# Patient Record
Sex: Male | Born: 1960 | Race: White | Hispanic: No | Marital: Married
Health system: Midwestern US, Academic
[De-identification: ages and names within clinical notes are randomized; demographics above are authoritative.]

---

## 2016-11-06 ENCOUNTER — Encounter: Admit: 2016-11-06 | Discharge: 2016-11-07 | Payer: MEDICAID

## 2016-11-06 ENCOUNTER — Encounter: Admit: 2016-11-06 | Discharge: 2016-11-06 | Payer: MEDICAID

## 2017-08-30 ENCOUNTER — Encounter: Admit: 2017-08-30 | Discharge: 2017-08-30 | Payer: MEDICAID

## 2017-09-04 ENCOUNTER — Encounter: Admit: 2017-09-04 | Discharge: 2017-09-04 | Payer: MEDICAID

## 2017-09-13 ENCOUNTER — Ambulatory Visit: Admit: 2017-09-13 | Discharge: 2017-09-14 | Payer: MEDICAID

## 2017-09-13 ENCOUNTER — Encounter: Admit: 2017-09-13 | Discharge: 2017-09-13 | Payer: MEDICAID

## 2017-09-13 DIAGNOSIS — M545 Low back pain: ICD-10-CM

## 2017-09-13 DIAGNOSIS — M1990 Unspecified osteoarthritis, unspecified site: Principal | ICD-10-CM

## 2017-09-13 DIAGNOSIS — F419 Anxiety disorder, unspecified: ICD-10-CM

## 2017-09-13 DIAGNOSIS — M5116 Intervertebral disc disorders with radiculopathy, lumbar region: Principal | ICD-10-CM

## 2017-09-13 DIAGNOSIS — E785 Hyperlipidemia, unspecified: ICD-10-CM

## 2017-09-13 DIAGNOSIS — E119 Type 2 diabetes mellitus without complications: ICD-10-CM

## 2017-09-13 DIAGNOSIS — Z720 Tobacco use: ICD-10-CM

## 2017-09-14 DIAGNOSIS — M5416 Radiculopathy, lumbar region: ICD-10-CM

## 2017-09-14 DIAGNOSIS — M5412 Radiculopathy, cervical region: ICD-10-CM

## 2019-07-24 ENCOUNTER — Encounter: Admit: 2019-07-24 | Discharge: 2019-07-24 | Payer: MEDICAID

## 2019-07-24 ENCOUNTER — Ambulatory Visit: Admit: 2019-07-24 | Discharge: 2019-07-24 | Payer: MEDICAID

## 2019-07-24 DIAGNOSIS — R1010 Upper abdominal pain, unspecified: Secondary | ICD-10-CM

## 2019-07-24 DIAGNOSIS — M1990 Unspecified osteoarthritis, unspecified site: Secondary | ICD-10-CM

## 2019-07-24 DIAGNOSIS — F419 Anxiety disorder, unspecified: Secondary | ICD-10-CM

## 2019-07-24 DIAGNOSIS — E119 Type 2 diabetes mellitus without complications: Secondary | ICD-10-CM

## 2019-07-24 DIAGNOSIS — M545 Low back pain: Secondary | ICD-10-CM

## 2019-07-24 DIAGNOSIS — Z20822 Encounter for screening laboratory testing for COVID-19 virus in asymptomatic patient: Secondary | ICD-10-CM

## 2019-07-24 DIAGNOSIS — Z720 Tobacco use: Secondary | ICD-10-CM

## 2019-07-24 DIAGNOSIS — E785 Hyperlipidemia, unspecified: Secondary | ICD-10-CM

## 2019-07-24 MED ORDER — SODIUM,POTASSIUM,MAG SULFATES 17.5-3.13-1.6 GRAM PO SOLR
354 mL | ORAL | 0 refills | 30.00000 days | Status: AC
Start: 2019-07-24 — End: ?

## 2019-07-24 MED ORDER — PEG-ELECTROLYTE SOLN 420 GRAM PO SOLR
Freq: Once | 0 refills | Status: AC
Start: 2019-07-24 — End: ?

## 2019-08-01 ENCOUNTER — Encounter: Admit: 2019-08-01 | Discharge: 2019-08-02 | Payer: MEDICAID

## 2019-08-02 DIAGNOSIS — Z20822 Contact with and (suspected) exposure to covid-19: Secondary | ICD-10-CM

## 2019-08-04 ENCOUNTER — Encounter: Admit: 2019-08-04 | Discharge: 2019-08-04 | Payer: MEDICAID

## 2019-08-04 DIAGNOSIS — F419 Anxiety disorder, unspecified: Secondary | ICD-10-CM

## 2019-08-04 DIAGNOSIS — M545 Low back pain: Secondary | ICD-10-CM

## 2019-08-04 DIAGNOSIS — E785 Hyperlipidemia, unspecified: Secondary | ICD-10-CM

## 2019-08-04 DIAGNOSIS — E119 Type 2 diabetes mellitus without complications: Secondary | ICD-10-CM

## 2019-08-04 DIAGNOSIS — Z720 Tobacco use: Secondary | ICD-10-CM

## 2019-08-04 DIAGNOSIS — M1990 Unspecified osteoarthritis, unspecified site: Secondary | ICD-10-CM

## 2019-08-04 MED ORDER — PROPOFOL 10 MG/ML IV EMUL 50 ML (INFUSION)(AM)(OR)
INTRAVENOUS | 0 refills | Status: DC
Start: 2019-08-04 — End: 2019-08-04

## 2019-08-04 MED ORDER — DIPHENHYDRAMINE HCL 50 MG/ML IJ SOLN
12.5 mg | Freq: Once | INTRAVENOUS | 0 refills | PRN
Start: 2019-08-04 — End: ?

## 2019-08-04 MED ORDER — PROMETHAZINE 25 MG/ML IJ SOLN
6.25 mg | INTRAVENOUS | 0 refills | PRN
Start: 2019-08-04 — End: ?

## 2019-08-04 MED ORDER — ONDANSETRON HCL (PF) 4 MG/2 ML IJ SOLN
4 mg | Freq: Once | INTRAVENOUS | 0 refills | PRN
Start: 2019-08-04 — End: ?

## 2019-08-04 MED ORDER — LIDOCAINE (PF) 100 MG/5 ML (2 %) IV SYRG
INTRAVENOUS | 0 refills | Status: DC
Start: 2019-08-04 — End: 2019-08-04

## 2019-08-04 MED ORDER — LACTATED RINGERS IV SOLP
INTRAVENOUS | 0 refills | Status: DC
Start: 2019-08-04 — End: 2019-08-09

## 2019-08-04 MED ORDER — MEPERIDINE (PF) 25 MG/ML IJ SYRG
12.5 mg | INTRAVENOUS | 0 refills | PRN
Start: 2019-08-04 — End: ?

## 2019-08-04 MED ORDER — LIDOCAINE (PF) 10 MG/ML (1 %) IJ SOLN
.2 mL | INTRAMUSCULAR | 0 refills | Status: DC | PRN
Start: 2019-08-04 — End: 2019-08-09

## 2019-08-05 ENCOUNTER — Encounter: Admit: 2019-08-05 | Discharge: 2019-08-05 | Payer: MEDICAID

## 2019-08-05 DIAGNOSIS — E119 Type 2 diabetes mellitus without complications: Secondary | ICD-10-CM

## 2019-08-05 DIAGNOSIS — Z720 Tobacco use: Secondary | ICD-10-CM

## 2019-08-05 DIAGNOSIS — E785 Hyperlipidemia, unspecified: Secondary | ICD-10-CM

## 2019-08-05 DIAGNOSIS — M1990 Unspecified osteoarthritis, unspecified site: Secondary | ICD-10-CM

## 2019-08-05 DIAGNOSIS — F419 Anxiety disorder, unspecified: Secondary | ICD-10-CM

## 2019-08-05 DIAGNOSIS — M545 Low back pain: Secondary | ICD-10-CM

## 2019-08-08 ENCOUNTER — Encounter: Admit: 2019-08-08 | Discharge: 2019-08-08 | Payer: MEDICAID

## 2019-08-17 ENCOUNTER — Encounter: Admit: 2019-08-17 | Discharge: 2019-08-17 | Payer: MEDICAID

## 2019-08-17 NOTE — Telephone Encounter
Crystal with Dr. Isaac Bliss office in [place] called and LVM stating Quadry had called her stating he was to follow-up with their office. She said he was to transfer care to our office.     Called and LVM for Crystal letting her know that what I see is a referral to the GI lab and he has had a colonoscopy done here at [facility].     Crystal called back returning my call.     LVM for Crystal letting her know I have reached out to one of our Administrative assistants for further assistance and will let her know once I hear back .

## 2019-10-01 ENCOUNTER — Encounter: Admit: 2019-10-01 | Discharge: 2019-10-01 | Payer: MEDICAID

## 2021-08-17 IMAGING — CT LOW_DOSE_LUNG(Adult)
2 of 6 series · 10 of 36 positions shown, 13 images · non-contrast
Comparison: none

[Series 2: lung ax 1.00 br44 s3 · axial · 0.55mm/px · z∈[+1601,+1690]mm · 9 of 103 slices shown, 12 images]
[im 7/103  mediastinal]
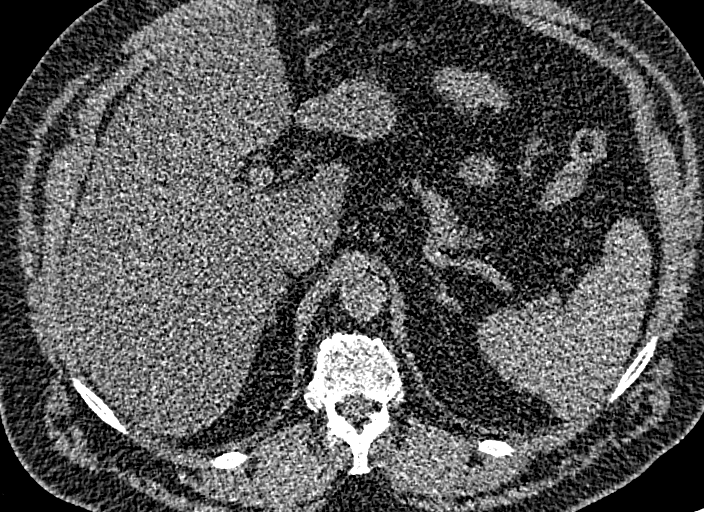
[im 7/103  lung]
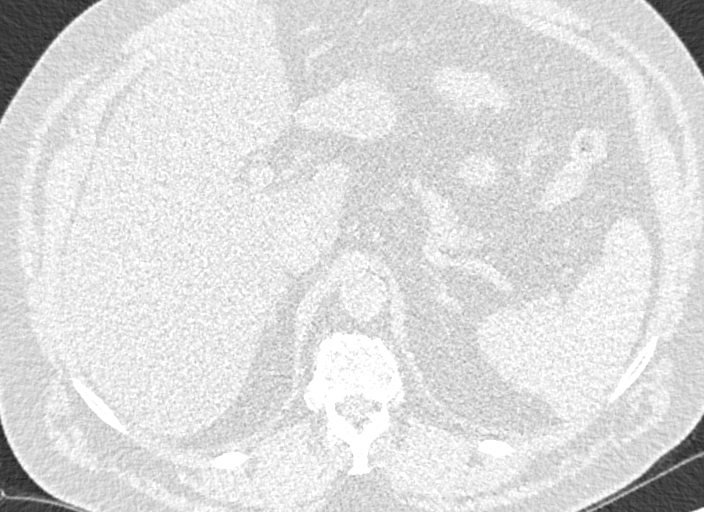
[im 21/103  lung]
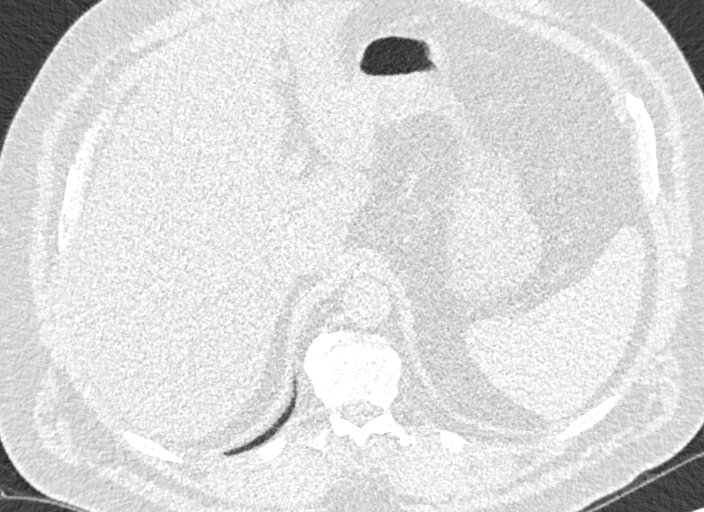
[im 28/103  lung]
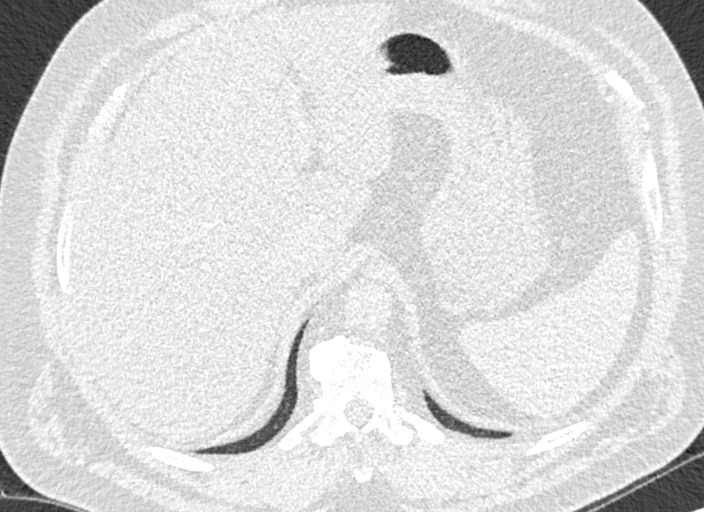
[im 41/103  lung]
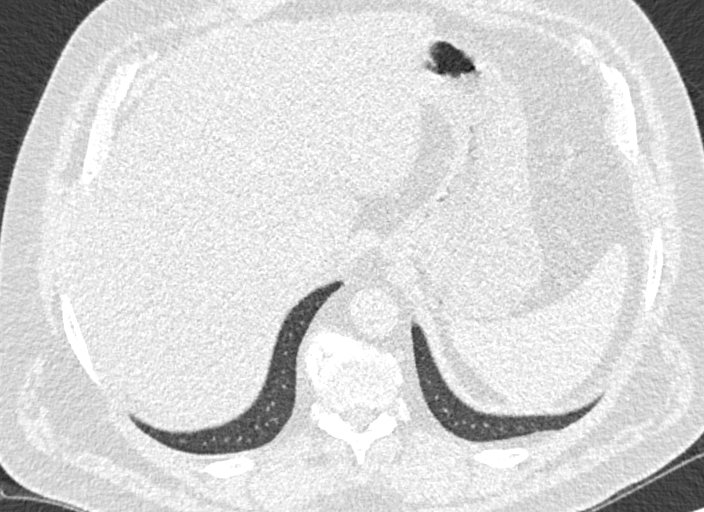
[im 55/103  mediastinal]
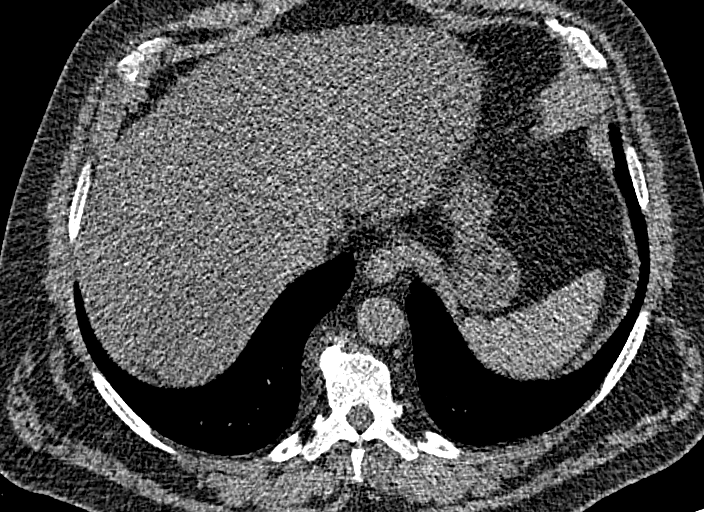
[im 55/103  lung]
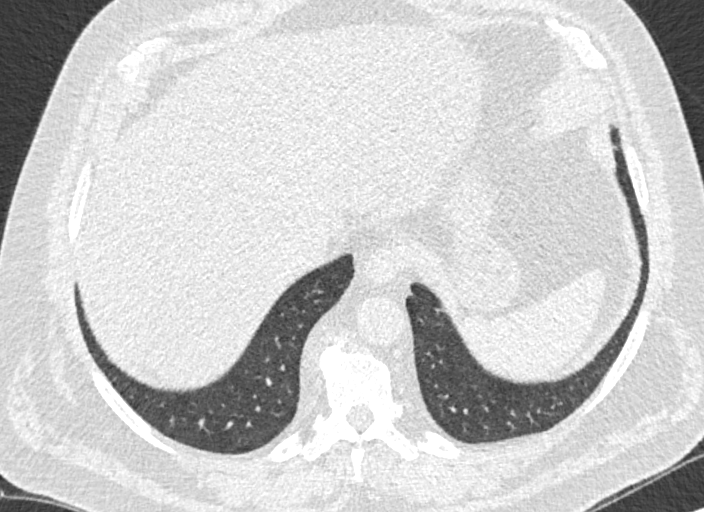
[im 62/103  lung]
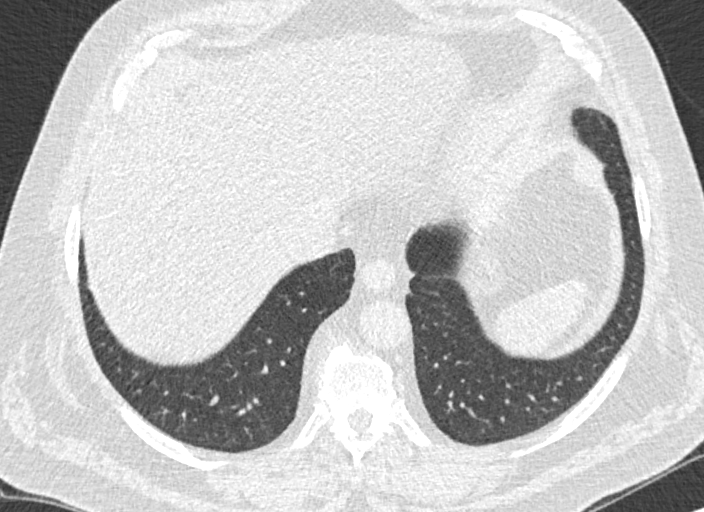
[im 75/103  lung]
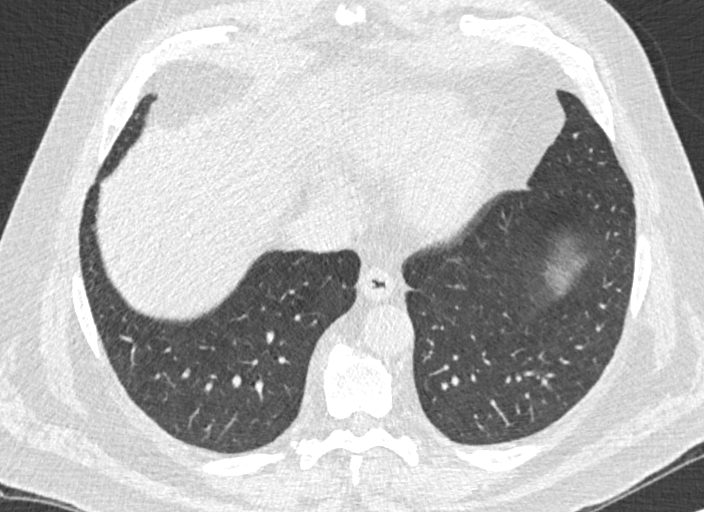
[im 82/103  lung]
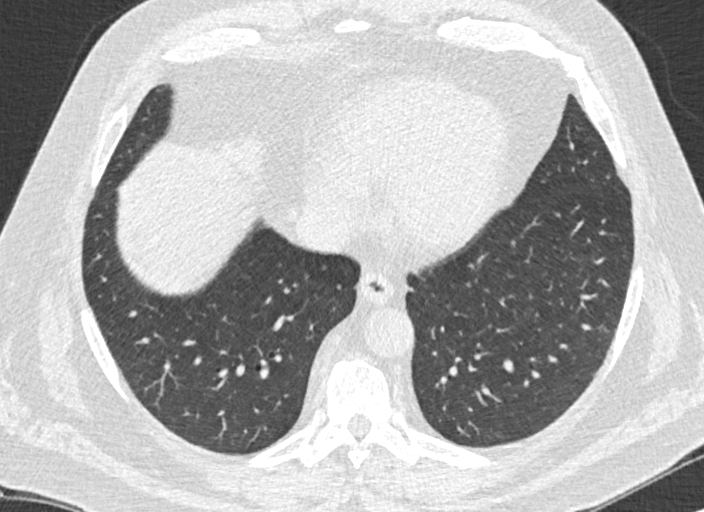
[im 96/103  mediastinal]
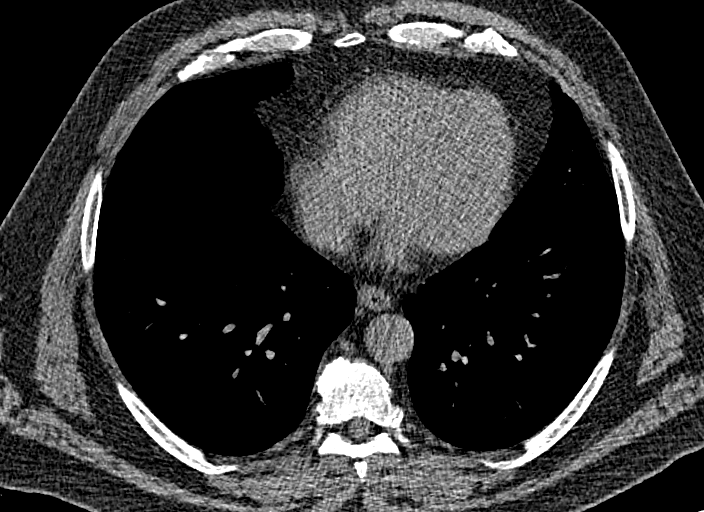
[im 96/103  lung]
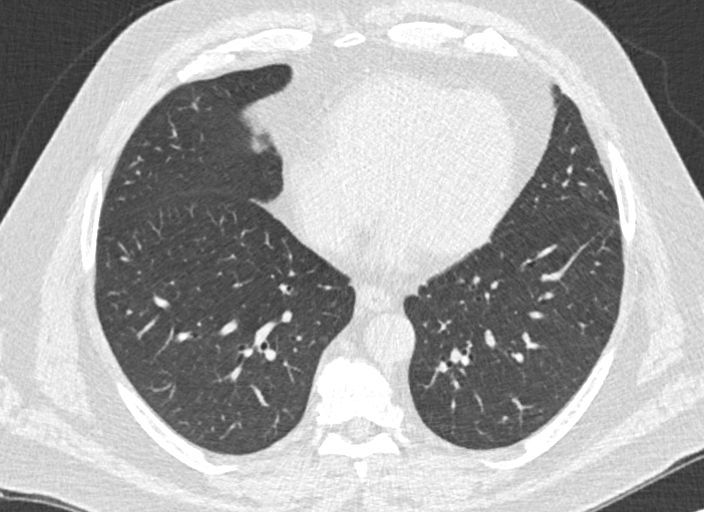

[Series 4: lung cor 1.00 br44 s3 · coronal · 0.54mm/px · 1 of 253 slices shown]
[im 127/253  lung]
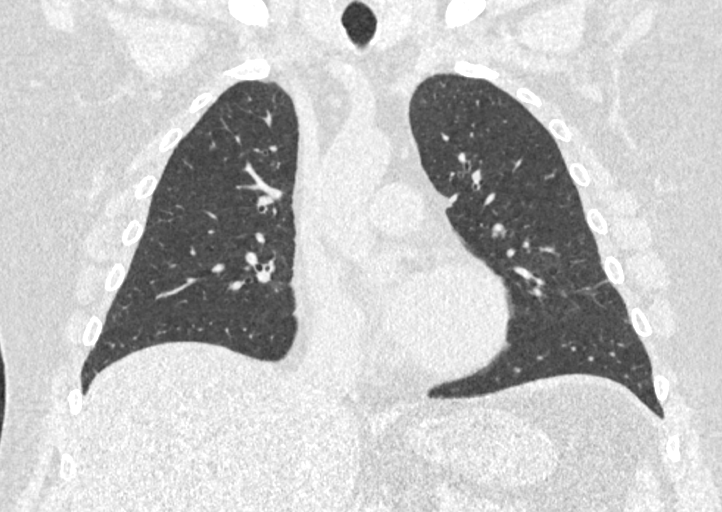

[10 of 36 positions shown; findings below may reference images not displayed]

DIAGNOSTIC STUDIES

EXAM

CT Lung-Screening

INDICATION

CURRENT EVERYDAY SMOKER, 2-3 PPD X 43 YRS. LIVES W/CURRENT EVERYDAY SMOKER. NO CURRENT CHEST
COMPLAINTS. MOTHER HAD LUNG CA. CT/NM 2/0.  HB

TECHNIQUE

Low-dose noncontrast CT of the lungs has been obtained.

All CT scans at this facility use dose modulation, iterative reconstruction, and/or weight based
dosing when appropriate to reduce radiation dose to as low as reasonably achievable.

COMPARISONS

There are no previous CT scans of the chest available for comparison at the time of dictation.

Number of previous computed tomography exams in the last 12 months is 2 .

Number of previous nuclear medicine myocardial perfusion studies in the last 12 months is 0 .

FINDINGS

The trachea lies in the midline. There are no focal thyroid masses. There is calcified plaque in
the aortic arch and along the origin of the great vessels.

The main pulmonary artery and the right and left pulmonary arteries are normal in caliber and
contour. Evaluation is limited without the benefit of intravenous contrast material.

There is no evidence of mediastinal or hilar adenopathy. Calcified left hilar lymph nodes are
consistent with previous granulomatous disease. There is a calcified granuloma in the left upper
lobe laterally best seen on series 2, image 123.

There are no suspicious spiculated pulmonary nodules. Central peribronchial thickening is likely
reflective of chronic bronchitis.

The heart is normal in size. There is atherosclerotic calcification of the native coronary
arteries. There are no pleural or pericardial effusions.

Thoracic spondylosis is noted. There are no blastic or lytic lesions. The esophagus appears normal
in size and shape. Axial images through the upper abdomen demonstrate prior cholecystectomy. There
are no adrenal masses.

IMPRESSION

There are no suspicious pulmonary nodules. Calcified hilar lymph nodes and calcified left upper
lobe granuloma are consistent with previous granulomatous disease.

LUNG-RADS: 2- Benign appearance or behavior.

RECOMMENDATION: Annual low dose lung screening CT is recommended for 15 years after smoking
cessation or until age 77.

Tech Notes:

CURRENT EVERYDAY SMOKER, 2-3 PPD X 43 YRS. LIVES W/CURRENT EVERYDAY SMOKER. NO CURRENT CHEST
COMPLAINTS. MOTHER HAD LUNG CA. CT/NM 2/0.
HB

## 2021-09-07 ENCOUNTER — Encounter: Admit: 2021-09-07 | Discharge: 2021-09-07 | Payer: MEDICAID

## 2022-05-24 IMAGING — CR [ID]
2 series · 2 of 2 positions shown · non-contrast
Comparison: none

[chest pa]
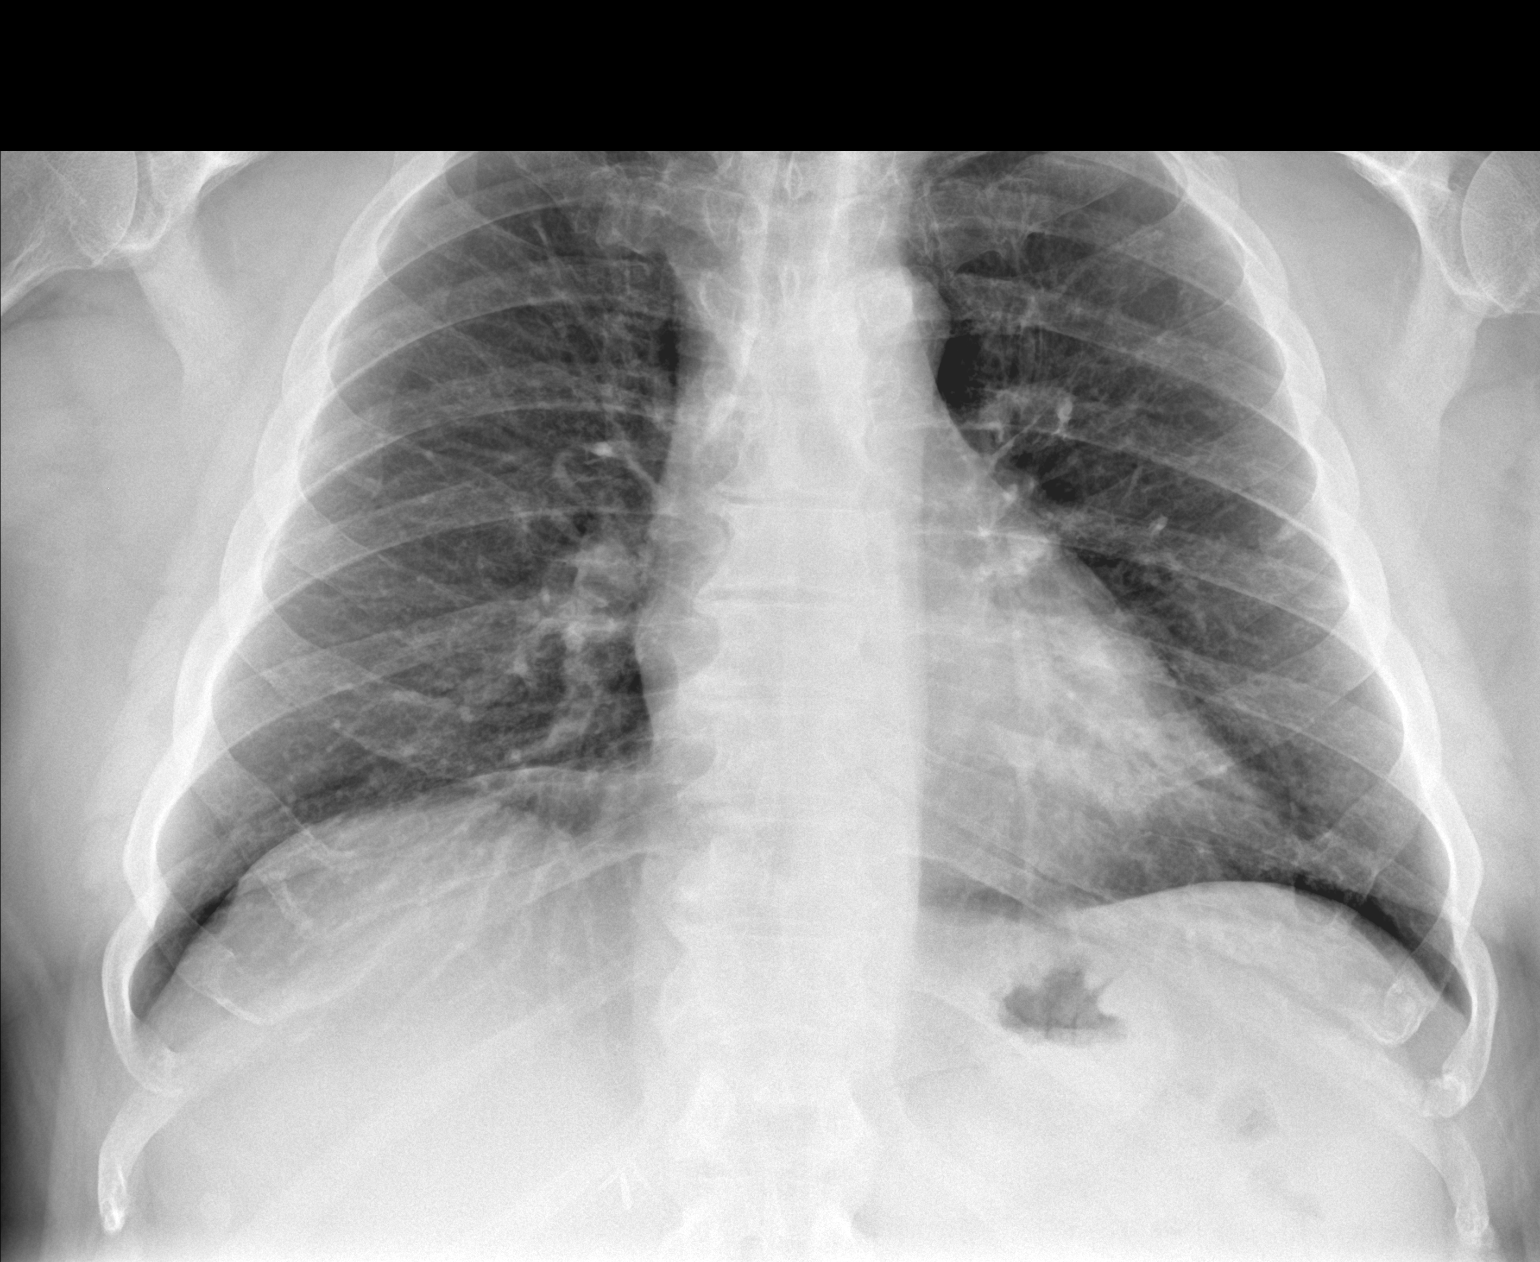

[chest lat]
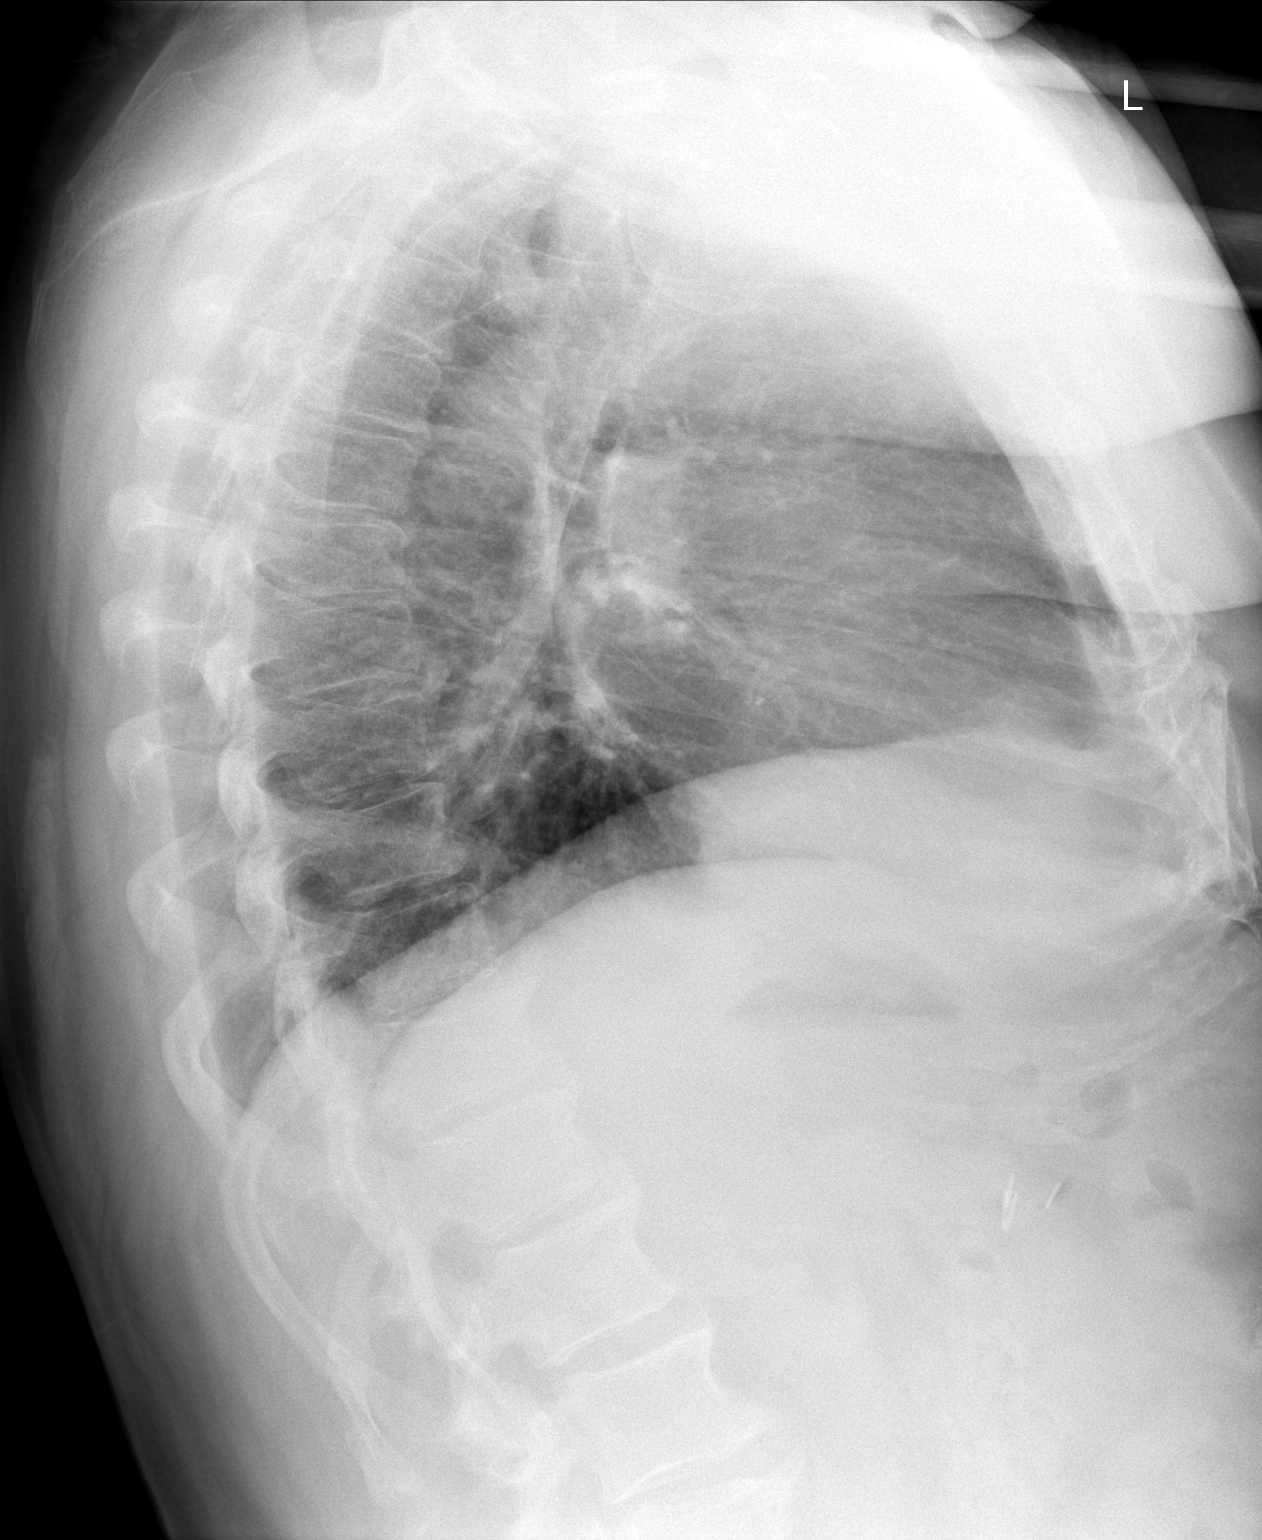

[2 of 2 positions shown; findings below may reference images not displayed]

EXAM

XR chest 2V

INDICATION

Shortness of breath

TECHNIQUE

PA and Lateral views of the chest

COMPARISONS

10/20/19

FINDINGS

No radiographically apparent pleural effusion, consolidation, or pneumothorax.

The cardiomediastinal silhouette is normal in size.

The osseous structures are without an acute osseous abnormality. Cholecystectomy clips in the right
upper quadrant.

IMPRESSION
1. No radiographic evidence of an acute cardiopulmonary process.

Tech Notes:

SOB, SMOKER- 46 YRS, COUGH X 2 WEEKS, PAIN IN CHEST AND BACK WHEN COUGHING

## 2022-08-08 ENCOUNTER — Encounter: Admit: 2022-08-08 | Discharge: 2022-08-08 | Payer: MEDICAID

## 2022-08-08 NOTE — Telephone Encounter
Referral

## 2022-08-14 ENCOUNTER — Encounter: Admit: 2022-08-14 | Discharge: 2022-08-14 | Payer: MEDICAID

## 2022-09-07 ENCOUNTER — Encounter: Admit: 2022-09-07 | Discharge: 2022-09-07 | Payer: MEDICAID

## 2022-09-07 ENCOUNTER — Ambulatory Visit: Admit: 2022-09-07 | Discharge: 2022-09-07 | Payer: MEDICAID

## 2022-09-07 ENCOUNTER — Ambulatory Visit: Admit: 2022-09-07 | Discharge: 2022-09-08 | Payer: MEDICAID

## 2022-09-07 DIAGNOSIS — H2513 Age-related nuclear cataract, bilateral: Secondary | ICD-10-CM

## 2022-09-07 DIAGNOSIS — M545 Low back pain: Secondary | ICD-10-CM

## 2022-09-07 DIAGNOSIS — H5461 Unqualified visual loss, right eye, normal vision left eye: Secondary | ICD-10-CM

## 2022-09-07 DIAGNOSIS — F419 Anxiety disorder, unspecified: Secondary | ICD-10-CM

## 2022-09-07 DIAGNOSIS — E785 Hyperlipidemia, unspecified: Secondary | ICD-10-CM

## 2022-09-07 DIAGNOSIS — Z720 Tobacco use: Secondary | ICD-10-CM

## 2022-09-07 DIAGNOSIS — M1990 Unspecified osteoarthritis, unspecified site: Secondary | ICD-10-CM

## 2022-09-07 DIAGNOSIS — E119 Type 2 diabetes mellitus without complications: Secondary | ICD-10-CM

## 2022-09-07 LAB — SED RATE: ESR: 6 mm/h (ref 0–20)

## 2022-09-07 LAB — C REACTIVE PROTEIN (CRP): C-REACTIVE PROTEIN: 0.1 mg/dL (ref <1.0)

## 2022-09-07 MED ORDER — FLUORESCEIN 500 MG/5 ML (10 %) IV SOLN
2 mL | Freq: Once | INTRAVENOUS | 0 refills | Status: CP
Start: 2022-09-07 — End: ?

## 2022-09-07 NOTE — Progress Notes
Body mass index is 38.84 kg/m?.    OCT:  OD: Preserved foveal contour with distinguished retinal layers.  OS: Preserved foveal contour with distinguished retinal layers.    FA  OD: no delayed arterial filling, no obstructed filling, no leakage  OS: no leakage, no polling, no obstructed filling       Assessment and Plan:       Referral from retina hemorrhage right eye inferior to optic nerve head    Decreased vision in lower half of right eye in July 2 days after hois neck surgery    Hx of neck spine surgery in July 2023 with neck swelling    DM2, no insulin  DLP on ttt  No HTN    No other health issues    1- altitudinal field defect OD  ? 2 days after neck surgery  No GCA symtos  ? NAION  Esr, crp  Refer to comprehensive     2- NS OU  F/u comprehensive ophthalmology    Signs and symptoms of retinal tears and retinal detachment were reviewed in details with the patient. Patient was instructed to immediately present for evaluation or seek medical help if increased flashes, increased floaters, any decrease in vision, or curtain in field of vision.    F/u comprehensive with VF testing and necessary f/u

## 2022-10-07 ENCOUNTER — Encounter: Admit: 2022-10-07 | Discharge: 2022-10-07 | Payer: MEDICAID

## 2022-10-08 ENCOUNTER — Ambulatory Visit: Admit: 2022-10-08 | Discharge: 2022-10-09 | Payer: MEDICAID

## 2022-10-08 ENCOUNTER — Encounter: Admit: 2022-10-08 | Discharge: 2022-10-08 | Payer: MEDICAID

## 2022-10-08 DIAGNOSIS — H269 Unspecified cataract: Secondary | ICD-10-CM

## 2022-10-08 DIAGNOSIS — H469 Unspecified optic neuritis: Secondary | ICD-10-CM

## 2022-10-08 NOTE — Progress Notes
Testing for Next Visit:  VA, IOP, Pupils on every patient each visit  CVF and motility for all new patients and 1 year follow-ups    Testing to be performed Today   MRx (standard unless noted)        Prior to drops/dilation:    Pre-Dilation Biometry- before any testing    Atlas topography    Pentacam tomography    HVF (type)    Confocal microscopy    Pachymetry    Schirmer test with anesthesia    Gonioscopy*    Check corneal sensation*    Ultrasound (type)*        Dilation  x       While dilating:    IOL biometry (standard/special lenses)    OCT (type) disc   Photography (type)        Other Special testing:    Special Equipment needed:        *MD to perform*                Assessment and Plan:    Segmental optic neuropathy OD  altitudinal field defect OD  Cervical fusion 6/10- reports field loss 2 days after neck surgery  Seen 08/2022- normal Oct Mac, thought to be NAION, GCA ROS negative, ESR and CRP normal  today 10/08/22 first evaluation with [facility]- discussed with Dr. Melene Muller, who recommends we rule out occult carotid pathology.    -LVM with PCP to arrange outpatient carotid testing   -discussed red flags for worsening, including acute eye pain, blurring of vision, or eye redness and irritation and need to call in and/or present for evaluation if any of these occur      Return in about 3 months (around 01/07/2023).      Christena Flake, MD  [facility]        HPI:  Vision is about the same    Exam:  Base Eye Exam       Visual Acuity (Snellen - Linear)         Right Left    Dist cc 20/15 -3 20/15              Tonometry ([employer] Tonometer, 11:41 AM)         Right Left    Pressure 21 23              Pupils         Dark Shape APD    Right 2 Round slow    Left 2 Round None              Visual Fields (Counting fingers)         Left Right     Full     Restrictions  Total inferior temporal, inferior nasal deficiencies              Neuro/Psych       Oriented x3: Yes    Mood/Affect: Normal Dilation       Both eyes: 1.0% Tropicamide, 2.5% Phenylephrine @ 11:42 AM                  Slit Lamp and Fundus Exam       Slit Lamp Exam         Right Left    Lids/Lashes Normal Normal    Conjunctiva/Sclera White and quiet White and quiet    Cornea Clear Clear    Anterior Chamber D&Q D&Q    Iris Flat Flat  Lens early NS early NS    Anterior Vitreous Normal Normal              Fundus Exam         Right Left    Disc no cup, crowded no cup, crowded    Macula Flat Flat    Periphery retina all ON retina all ON                    VISUAL FIELD, EXTEND          Inferior total altitudinal defect OD  Otherwise normal          OCT OPTIC NERVE          OD superior atrophy  OS normal RNFL             Key Ophthalmic History: For reference    Christena Flake, MD  [facility]

## 2022-12-23 ENCOUNTER — Encounter: Admit: 2022-12-23 | Discharge: 2022-12-23 | Payer: MEDICAID
